# Patient Record
Sex: Female | Born: 1985 | Race: White | Hispanic: No | Marital: Single
Health system: Southern US, Community
[De-identification: ages and names within clinical notes are randomized; demographics above are authoritative.]

---

## 2012-08-13 ENCOUNTER — Other Ambulatory Visit: Payer: Self-pay | Admitting: Nurse Practitioner

## 2012-08-13 DIAGNOSIS — N63 Unspecified lump in unspecified breast: Secondary | ICD-10-CM

## 2012-08-15 ENCOUNTER — Ambulatory Visit
Admission: RE | Admit: 2012-08-15 | Discharge: 2012-08-15 | Disposition: A | Discharge status: Home / Self Care | Payer: 59 | Source: Ambulatory Visit | Attending: Nurse Practitioner | Admitting: Nurse Practitioner

## 2012-08-15 DIAGNOSIS — N63 Unspecified lump in unspecified breast: Secondary | ICD-10-CM

## 2012-11-08 ENCOUNTER — Ambulatory Visit: Payer: Self-pay

## 2012-11-08 ENCOUNTER — Other Ambulatory Visit: Payer: Self-pay | Admitting: Occupational Medicine

## 2012-11-08 DIAGNOSIS — R7612 Nonspecific reaction to cell mediated immunity measurement of gamma interferon antigen response without active tuberculosis: Secondary | ICD-10-CM

## 2014-11-24 IMAGING — CR DG CHEST 1V
1 series · 1 of 1 positions shown · non-contrast
Comparison: None.

CLINICAL DATA: Positive quantiFERON tuberculosis gold skin test

CHEST - 1 VIEW

[view not recorded]
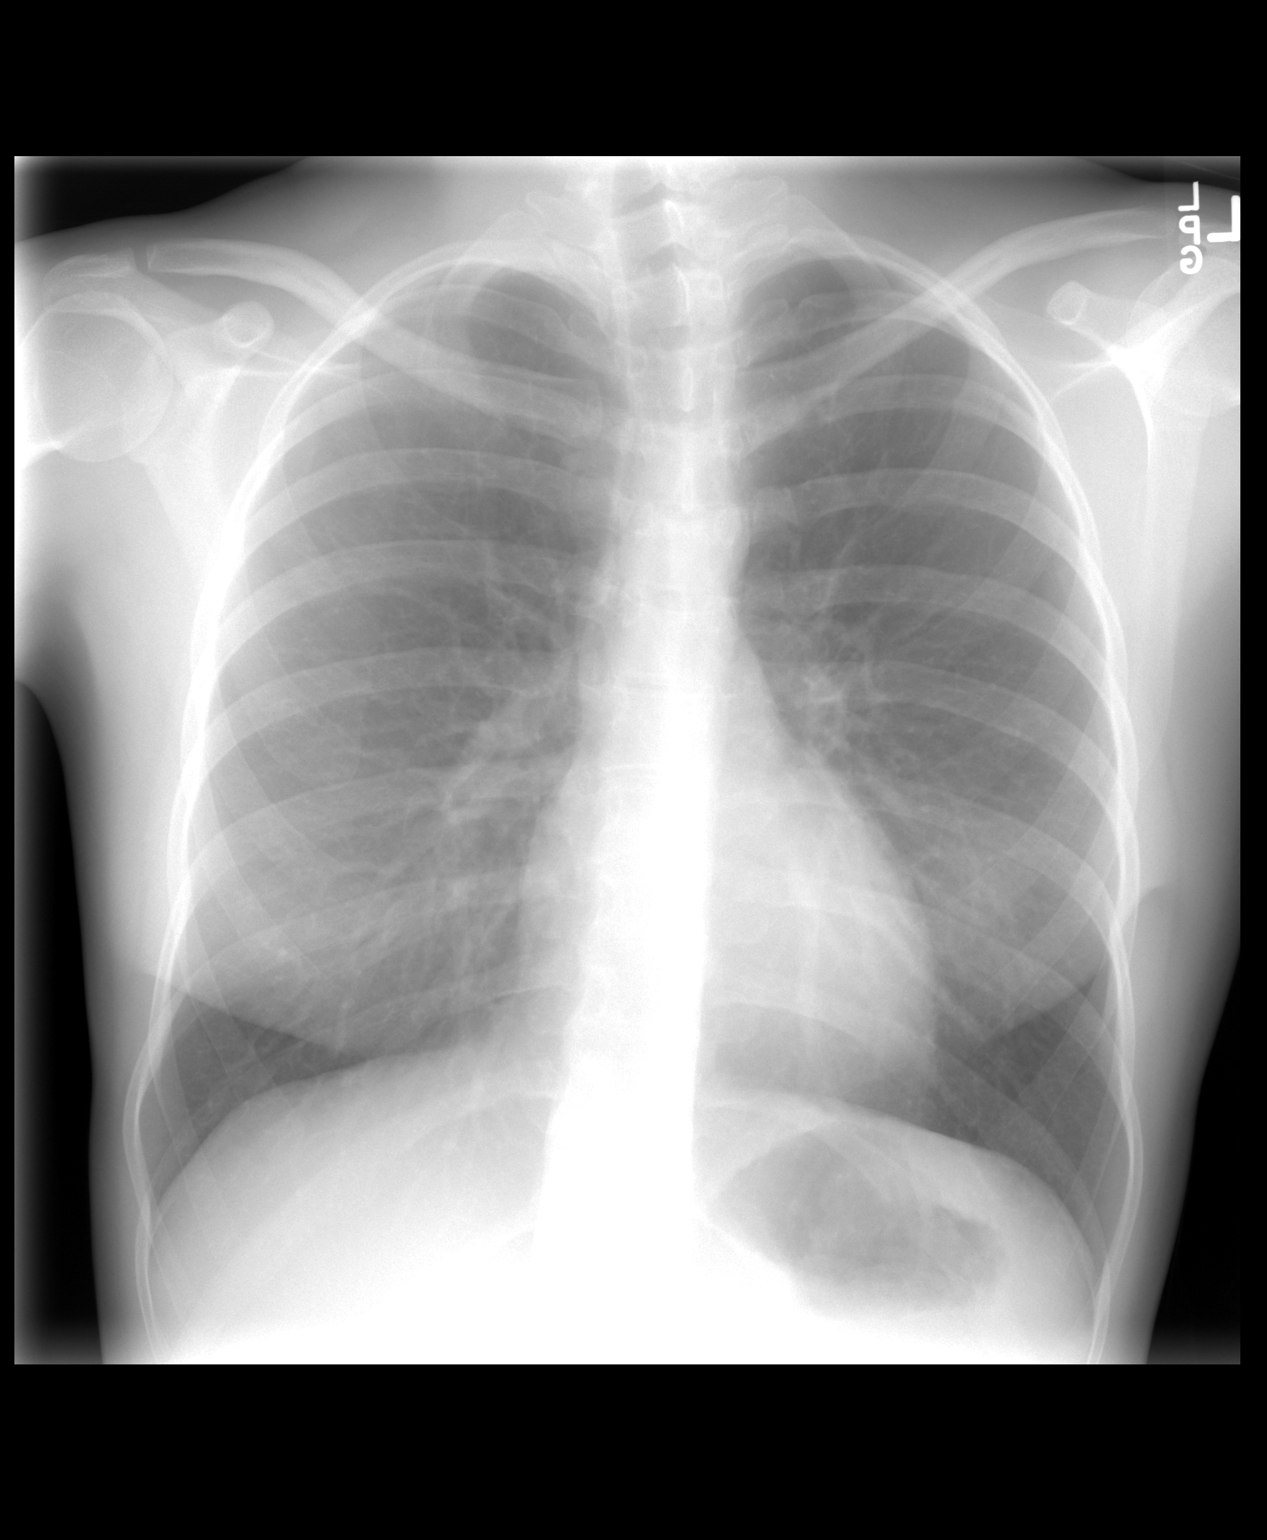

[1 of 1 positions shown; findings below may reference images not displayed]

FINDINGS: Lungs clear.  Heart size and pulmonary vascularity are
normal.  No adenopathy.  There is mild thoracic levoscoliosis.
IMPRESSION: Lungs clear.  No adenopathy.

## 2015-09-17 DIAGNOSIS — H5213 Myopia, bilateral: Secondary | ICD-10-CM | POA: Diagnosis not present

## 2016-05-12 DIAGNOSIS — D1801 Hemangioma of skin and subcutaneous tissue: Secondary | ICD-10-CM | POA: Diagnosis not present

## 2016-05-12 DIAGNOSIS — D225 Melanocytic nevi of trunk: Secondary | ICD-10-CM | POA: Diagnosis not present

## 2016-05-12 DIAGNOSIS — L814 Other melanin hyperpigmentation: Secondary | ICD-10-CM | POA: Diagnosis not present

## 2016-07-05 DIAGNOSIS — Z0000 Encounter for general adult medical examination without abnormal findings: Secondary | ICD-10-CM | POA: Diagnosis not present

## 2016-09-15 DIAGNOSIS — H5213 Myopia, bilateral: Secondary | ICD-10-CM | POA: Diagnosis not present

## 2016-10-04 ENCOUNTER — Encounter: Payer: 59 | Attending: Internal Medicine | Admitting: Registered"

## 2016-10-04 DIAGNOSIS — Z713 Dietary counseling and surveillance: Secondary | ICD-10-CM | POA: Diagnosis not present

## 2016-10-04 NOTE — Progress Notes (Signed)
Patient was seen on 10/04/2016 for the Weight Loss Class at the [facility]. The following learning objectives were met by the patient during this class:   Describe healthy choices in each food group  Describe portion size of foods  Use plate method for meal planning  Demonstrate how to read Nutrition Facts food label  Set realistic goals for weight loss, diet changes, and physical activity.   Goals:  1. Make healthy food choices in each food group.  2. Reduce portion size of foods.  3. Increase fruit and vegetable intake.  4. Use plate method for meal planning.  5. Increase physical activity.    Handouts given:   1. Power Point of presentation   2. Meal plan form    3. Which Drinks have Sugar   4. Weight Management Apps and Websites   

## 2017-03-08 ENCOUNTER — Other Ambulatory Visit: Payer: Self-pay | Admitting: Internal Medicine

## 2017-03-08 DIAGNOSIS — R5381 Other malaise: Secondary | ICD-10-CM

## 2017-03-14 ENCOUNTER — Other Ambulatory Visit: Payer: Self-pay | Admitting: Internal Medicine

## 2017-03-14 DIAGNOSIS — N644 Mastodynia: Secondary | ICD-10-CM

## 2017-03-14 DIAGNOSIS — N6002 Solitary cyst of left breast: Secondary | ICD-10-CM

## 2017-03-22 ENCOUNTER — Ambulatory Visit
Admission: RE | Admit: 2017-03-22 | Discharge: 2017-03-22 | Disposition: A | Discharge status: Home / Self Care | Payer: 59 | Source: Ambulatory Visit | Attending: Internal Medicine | Admitting: Internal Medicine

## 2017-03-22 ENCOUNTER — Encounter (INDEPENDENT_AMBULATORY_CARE_PROVIDER_SITE_OTHER): Payer: Self-pay

## 2017-03-22 DIAGNOSIS — N6002 Solitary cyst of left breast: Secondary | ICD-10-CM

## 2017-03-22 DIAGNOSIS — N644 Mastodynia: Secondary | ICD-10-CM

## 2017-06-22 DIAGNOSIS — D1801 Hemangioma of skin and subcutaneous tissue: Secondary | ICD-10-CM | POA: Diagnosis not present

## 2017-06-22 DIAGNOSIS — L905 Scar conditions and fibrosis of skin: Secondary | ICD-10-CM | POA: Diagnosis not present

## 2017-06-22 DIAGNOSIS — D225 Melanocytic nevi of trunk: Secondary | ICD-10-CM | POA: Diagnosis not present

## 2017-09-11 DIAGNOSIS — J069 Acute upper respiratory infection, unspecified: Secondary | ICD-10-CM | POA: Diagnosis not present

## 2017-09-14 DIAGNOSIS — J0140 Acute pansinusitis, unspecified: Secondary | ICD-10-CM | POA: Diagnosis not present

## 2017-10-11 DIAGNOSIS — H5213 Myopia, bilateral: Secondary | ICD-10-CM | POA: Diagnosis not present

## 2017-11-03 DIAGNOSIS — Z13220 Encounter for screening for lipoid disorders: Secondary | ICD-10-CM | POA: Diagnosis not present

## 2017-11-03 DIAGNOSIS — Z0000 Encounter for general adult medical examination without abnormal findings: Secondary | ICD-10-CM | POA: Diagnosis not present

## 2017-11-03 DIAGNOSIS — Z1329 Encounter for screening for other suspected endocrine disorder: Secondary | ICD-10-CM | POA: Diagnosis not present

## 2017-11-03 DIAGNOSIS — E781 Pure hyperglyceridemia: Secondary | ICD-10-CM | POA: Diagnosis not present

## 2017-11-03 DIAGNOSIS — Z131 Encounter for screening for diabetes mellitus: Secondary | ICD-10-CM | POA: Diagnosis not present

## 2017-11-09 DIAGNOSIS — Z0000 Encounter for general adult medical examination without abnormal findings: Secondary | ICD-10-CM | POA: Diagnosis not present

## 2018-03-20 DIAGNOSIS — R87610 Atypical squamous cells of undetermined significance on cytologic smear of cervix (ASC-US): Secondary | ICD-10-CM | POA: Diagnosis not present

## 2018-03-20 DIAGNOSIS — Z01419 Encounter for gynecological examination (general) (routine) without abnormal findings: Secondary | ICD-10-CM | POA: Diagnosis not present

## 2018-03-20 DIAGNOSIS — N898 Other specified noninflammatory disorders of vagina: Secondary | ICD-10-CM | POA: Diagnosis not present

## 2018-03-20 DIAGNOSIS — Z01411 Encounter for gynecological examination (general) (routine) with abnormal findings: Secondary | ICD-10-CM | POA: Diagnosis not present

## 2018-09-13 DIAGNOSIS — H5213 Myopia, bilateral: Secondary | ICD-10-CM | POA: Diagnosis not present

## 2018-09-21 DIAGNOSIS — M542 Cervicalgia: Secondary | ICD-10-CM | POA: Diagnosis not present

## 2018-09-21 DIAGNOSIS — M545 Low back pain: Secondary | ICD-10-CM | POA: Diagnosis not present

## 2018-09-21 DIAGNOSIS — Y92410 Unspecified street and highway as the place of occurrence of the external cause: Secondary | ICD-10-CM | POA: Diagnosis not present

## 2018-09-21 DIAGNOSIS — Y999 Unspecified external cause status: Secondary | ICD-10-CM | POA: Diagnosis not present

## 2018-09-21 DIAGNOSIS — S199XXA Unspecified injury of neck, initial encounter: Secondary | ICD-10-CM | POA: Diagnosis not present

## 2018-09-21 DIAGNOSIS — Z3202 Encounter for pregnancy test, result negative: Secondary | ICD-10-CM | POA: Diagnosis not present

## 2018-09-27 ENCOUNTER — Telehealth: Payer: 59 | Admitting: Family

## 2018-09-27 DIAGNOSIS — R05 Cough: Secondary | ICD-10-CM

## 2018-09-27 DIAGNOSIS — J0141 Acute recurrent pansinusitis: Secondary | ICD-10-CM | POA: Diagnosis not present

## 2018-09-27 DIAGNOSIS — J060 Acute laryngopharyngitis: Secondary | ICD-10-CM | POA: Diagnosis not present

## 2018-09-27 DIAGNOSIS — J069 Acute upper respiratory infection, unspecified: Secondary | ICD-10-CM | POA: Diagnosis not present

## 2018-09-27 DIAGNOSIS — R059 Cough, unspecified: Secondary | ICD-10-CM

## 2018-09-27 DIAGNOSIS — R42 Dizziness and giddiness: Secondary | ICD-10-CM
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
## 2019-02-07 DIAGNOSIS — E781 Pure hyperglyceridemia: Secondary | ICD-10-CM | POA: Diagnosis not present

## 2019-02-07 DIAGNOSIS — Z0000 Encounter for general adult medical examination without abnormal findings: Secondary | ICD-10-CM | POA: Diagnosis not present

## 2019-02-07 DIAGNOSIS — Z131 Encounter for screening for diabetes mellitus: Secondary | ICD-10-CM | POA: Diagnosis not present

## 2019-02-07 DIAGNOSIS — Z1329 Encounter for screening for other suspected endocrine disorder: Secondary | ICD-10-CM | POA: Diagnosis not present

## 2019-02-11 DIAGNOSIS — Z0000 Encounter for general adult medical examination without abnormal findings: Secondary | ICD-10-CM | POA: Diagnosis not present

## 2019-03-13 DIAGNOSIS — D2262 Melanocytic nevi of left upper limb, including shoulder: Secondary | ICD-10-CM | POA: Diagnosis not present

## 2019-03-13 DIAGNOSIS — A6920 Lyme disease, unspecified: Secondary | ICD-10-CM | POA: Diagnosis not present

## 2019-03-13 DIAGNOSIS — L814 Other melanin hyperpigmentation: Secondary | ICD-10-CM | POA: Diagnosis not present

## 2019-03-13 DIAGNOSIS — D2261 Melanocytic nevi of right upper limb, including shoulder: Secondary | ICD-10-CM | POA: Diagnosis not present

## 2019-03-13 DIAGNOSIS — D225 Melanocytic nevi of trunk: Secondary | ICD-10-CM | POA: Diagnosis not present

## 2019-03-13 DIAGNOSIS — D1801 Hemangioma of skin and subcutaneous tissue: Secondary | ICD-10-CM | POA: Diagnosis not present

## 2019-09-15 DIAGNOSIS — H5213 Myopia, bilateral: Secondary | ICD-10-CM | POA: Diagnosis not present

## 2019-09-15 DIAGNOSIS — H04123 Dry eye syndrome of bilateral lacrimal glands: Secondary | ICD-10-CM | POA: Diagnosis not present

## 2020-02-13 DIAGNOSIS — Z0000 Encounter for general adult medical examination without abnormal findings: Secondary | ICD-10-CM | POA: Diagnosis not present

## 2020-02-18 DIAGNOSIS — Z0000 Encounter for general adult medical examination without abnormal findings: Secondary | ICD-10-CM | POA: Diagnosis not present

## 2020-03-16 DIAGNOSIS — L308 Other specified dermatitis: Secondary | ICD-10-CM | POA: Diagnosis not present

## 2020-03-16 DIAGNOSIS — D1801 Hemangioma of skin and subcutaneous tissue: Secondary | ICD-10-CM | POA: Diagnosis not present

## 2020-03-16 DIAGNOSIS — D225 Melanocytic nevi of trunk: Secondary | ICD-10-CM | POA: Diagnosis not present

## 2020-03-16 DIAGNOSIS — D229 Melanocytic nevi, unspecified: Secondary | ICD-10-CM | POA: Diagnosis not present

## 2020-03-16 DIAGNOSIS — X32XXXS Exposure to sunlight, sequela: Secondary | ICD-10-CM | POA: Diagnosis not present

## 2020-03-16 DIAGNOSIS — L814 Other melanin hyperpigmentation: Secondary | ICD-10-CM | POA: Diagnosis not present

## 2020-09-15 DIAGNOSIS — H04123 Dry eye syndrome of bilateral lacrimal glands: Secondary | ICD-10-CM | POA: Diagnosis not present

## 2020-09-15 DIAGNOSIS — H5213 Myopia, bilateral: Secondary | ICD-10-CM | POA: Diagnosis not present

## 2021-02-18 DIAGNOSIS — Z1329 Encounter for screening for other suspected endocrine disorder: Secondary | ICD-10-CM | POA: Diagnosis not present

## 2021-02-18 DIAGNOSIS — Z13220 Encounter for screening for lipoid disorders: Secondary | ICD-10-CM | POA: Diagnosis not present

## 2021-02-18 DIAGNOSIS — Z0000 Encounter for general adult medical examination without abnormal findings: Secondary | ICD-10-CM | POA: Diagnosis not present

## 2021-02-18 DIAGNOSIS — Z1159 Encounter for screening for other viral diseases: Secondary | ICD-10-CM | POA: Diagnosis not present

## 2021-02-21 DIAGNOSIS — Z1159 Encounter for screening for other viral diseases: Secondary | ICD-10-CM | POA: Diagnosis not present

## 2021-02-21 DIAGNOSIS — Z0000 Encounter for general adult medical examination without abnormal findings: Secondary | ICD-10-CM | POA: Diagnosis not present

## 2021-03-15 DIAGNOSIS — L308 Other specified dermatitis: Secondary | ICD-10-CM | POA: Diagnosis not present

## 2021-03-15 DIAGNOSIS — D1801 Hemangioma of skin and subcutaneous tissue: Secondary | ICD-10-CM | POA: Diagnosis not present

## 2021-03-15 DIAGNOSIS — X32XXXS Exposure to sunlight, sequela: Secondary | ICD-10-CM | POA: Diagnosis not present

## 2021-03-15 DIAGNOSIS — D229 Melanocytic nevi, unspecified: Secondary | ICD-10-CM | POA: Diagnosis not present

## 2021-03-15 DIAGNOSIS — L814 Other melanin hyperpigmentation: Secondary | ICD-10-CM | POA: Diagnosis not present

## 2021-03-15 DIAGNOSIS — D225 Melanocytic nevi of trunk: Secondary | ICD-10-CM | POA: Diagnosis not present

## 2021-04-22 DIAGNOSIS — Z01419 Encounter for gynecological examination (general) (routine) without abnormal findings: Secondary | ICD-10-CM | POA: Diagnosis not present

## 2021-04-22 DIAGNOSIS — Z1151 Encounter for screening for human papillomavirus (HPV): Secondary | ICD-10-CM | POA: Diagnosis not present

## 2021-04-22 DIAGNOSIS — Z01411 Encounter for gynecological examination (general) (routine) with abnormal findings: Secondary | ICD-10-CM | POA: Diagnosis not present

## 2021-09-21 DIAGNOSIS — H5213 Myopia, bilateral: Secondary | ICD-10-CM | POA: Diagnosis not present

## 2021-09-21 DIAGNOSIS — H5111 Convergence insufficiency: Secondary | ICD-10-CM | POA: Diagnosis not present

## 2021-09-21 DIAGNOSIS — H04123 Dry eye syndrome of bilateral lacrimal glands: Secondary | ICD-10-CM | POA: Diagnosis not present

## 2021-10-26 DIAGNOSIS — Z0000 Encounter for general adult medical examination without abnormal findings: Secondary | ICD-10-CM | POA: Diagnosis not present

## 2022-02-18 DIAGNOSIS — Z1329 Encounter for screening for other suspected endocrine disorder: Secondary | ICD-10-CM | POA: Diagnosis not present

## 2022-02-18 DIAGNOSIS — Z1321 Encounter for screening for nutritional disorder: Secondary | ICD-10-CM | POA: Diagnosis not present

## 2022-02-18 DIAGNOSIS — Z130 Encounter for screening for diseases of the blood and blood-forming organs and certain disorders involving the immune mechanism: Secondary | ICD-10-CM | POA: Diagnosis not present

## 2022-02-18 DIAGNOSIS — Z13228 Encounter for screening for other metabolic disorders: Secondary | ICD-10-CM | POA: Diagnosis not present

## 2022-02-18 DIAGNOSIS — Z0000 Encounter for general adult medical examination without abnormal findings: Secondary | ICD-10-CM | POA: Diagnosis not present

## 2022-02-18 DIAGNOSIS — Z13220 Encounter for screening for lipoid disorders: Secondary | ICD-10-CM | POA: Diagnosis not present

## 2022-02-24 DIAGNOSIS — Z0000 Encounter for general adult medical examination without abnormal findings: Secondary | ICD-10-CM | POA: Diagnosis not present

## 2022-03-15 DIAGNOSIS — D1801 Hemangioma of skin and subcutaneous tissue: Secondary | ICD-10-CM | POA: Diagnosis not present

## 2022-03-15 DIAGNOSIS — D229 Melanocytic nevi, unspecified: Secondary | ICD-10-CM | POA: Diagnosis not present

## 2022-03-15 DIAGNOSIS — L308 Other specified dermatitis: Secondary | ICD-10-CM | POA: Diagnosis not present

## 2022-03-15 DIAGNOSIS — L280 Lichen simplex chronicus: Secondary | ICD-10-CM | POA: Diagnosis not present

## 2022-03-15 DIAGNOSIS — D225 Melanocytic nevi of trunk: Secondary | ICD-10-CM | POA: Diagnosis not present

## 2022-05-23 DIAGNOSIS — Z01419 Encounter for gynecological examination (general) (routine) without abnormal findings: Secondary | ICD-10-CM | POA: Diagnosis not present

## 2022-09-22 DIAGNOSIS — H5213 Myopia, bilateral: Secondary | ICD-10-CM | POA: Diagnosis not present

## 2022-11-03 DIAGNOSIS — R7989 Other specified abnormal findings of blood chemistry: Secondary | ICD-10-CM | POA: Diagnosis not present

## 2022-11-03 DIAGNOSIS — G911 Obstructive hydrocephalus: Secondary | ICD-10-CM | POA: Diagnosis not present

## 2023-02-19 DIAGNOSIS — Z0000 Encounter for general adult medical examination without abnormal findings: Secondary | ICD-10-CM | POA: Diagnosis not present

## 2023-02-19 DIAGNOSIS — E559 Vitamin D deficiency, unspecified: Secondary | ICD-10-CM | POA: Diagnosis not present

## 2023-02-21 ENCOUNTER — Other Ambulatory Visit: Payer: Self-pay | Admitting: Oncology

## 2023-02-21 DIAGNOSIS — Z006 Encounter for examination for normal comparison and control in clinical research program: Secondary | ICD-10-CM

## 2023-02-22 ENCOUNTER — Other Ambulatory Visit (HOSPITAL_COMMUNITY)
Admission: AD | Admit: 2023-02-22 | Discharge: 2023-02-22 | Disposition: A | Discharge status: Home / Self Care | Payer: Commercial Managed Care - PPO | Source: Ambulatory Visit | Attending: Oncology | Admitting: Oncology

## 2023-02-22 DIAGNOSIS — Z006 Encounter for examination for normal comparison and control in clinical research program: Secondary | ICD-10-CM | POA: Insufficient documentation

## 2023-02-27 DIAGNOSIS — E559 Vitamin D deficiency, unspecified: Secondary | ICD-10-CM | POA: Diagnosis not present

## 2023-02-27 DIAGNOSIS — R7989 Other specified abnormal findings of blood chemistry: Secondary | ICD-10-CM | POA: Diagnosis not present

## 2023-02-27 DIAGNOSIS — K219 Gastro-esophageal reflux disease without esophagitis: Secondary | ICD-10-CM | POA: Diagnosis not present

## 2023-02-27 DIAGNOSIS — Z0000 Encounter for general adult medical examination without abnormal findings: Secondary | ICD-10-CM | POA: Diagnosis not present

## 2023-02-27 DIAGNOSIS — Z114 Encounter for screening for human immunodeficiency virus [HIV]: Secondary | ICD-10-CM | POA: Diagnosis not present

## 2023-04-02 DIAGNOSIS — D229 Melanocytic nevi, unspecified: Secondary | ICD-10-CM | POA: Diagnosis not present

## 2023-04-02 DIAGNOSIS — D485 Neoplasm of uncertain behavior of skin: Secondary | ICD-10-CM | POA: Diagnosis not present

## 2023-04-02 DIAGNOSIS — L814 Other melanin hyperpigmentation: Secondary | ICD-10-CM | POA: Diagnosis not present

## 2023-04-02 DIAGNOSIS — D235 Other benign neoplasm of skin of trunk: Secondary | ICD-10-CM | POA: Diagnosis not present

## 2023-05-11 DIAGNOSIS — S61012A Laceration without foreign body of left thumb without damage to nail, initial encounter: Secondary | ICD-10-CM | POA: Diagnosis not present

## 2023-06-25 DIAGNOSIS — Z01419 Encounter for gynecological examination (general) (routine) without abnormal findings: Secondary | ICD-10-CM | POA: Diagnosis not present

## 2023-09-24 DIAGNOSIS — H5213 Myopia, bilateral: Secondary | ICD-10-CM | POA: Diagnosis not present

## 2024-02-29 DIAGNOSIS — Z13220 Encounter for screening for lipoid disorders: Secondary | ICD-10-CM | POA: Diagnosis not present

## 2024-02-29 DIAGNOSIS — Z131 Encounter for screening for diabetes mellitus: Secondary | ICD-10-CM | POA: Diagnosis not present

## 2024-02-29 DIAGNOSIS — Z0000 Encounter for general adult medical examination without abnormal findings: Secondary | ICD-10-CM | POA: Diagnosis not present

## 2024-02-29 DIAGNOSIS — Z1329 Encounter for screening for other suspected endocrine disorder: Secondary | ICD-10-CM | POA: Diagnosis not present

## 2024-02-29 DIAGNOSIS — E559 Vitamin D deficiency, unspecified: Secondary | ICD-10-CM | POA: Diagnosis not present

## 2024-04-01 DIAGNOSIS — D229 Melanocytic nevi, unspecified: Secondary | ICD-10-CM | POA: Diagnosis not present

## 2024-04-01 DIAGNOSIS — L308 Other specified dermatitis: Secondary | ICD-10-CM | POA: Diagnosis not present

## 2024-04-01 DIAGNOSIS — Z86018 Personal history of other benign neoplasm: Secondary | ICD-10-CM | POA: Diagnosis not present

## 2024-06-25 DIAGNOSIS — Z01419 Encounter for gynecological examination (general) (routine) without abnormal findings: Secondary | ICD-10-CM | POA: Diagnosis not present
